# Patient Record
Sex: Male | Born: 1995 | Race: Black or African American | Hispanic: No | Marital: Single | Smoking: Never smoker
Health system: Southern US, Community
[De-identification: ages and names within clinical notes are randomized; demographics above are authoritative.]

## PROBLEM LIST (undated history)

## (undated) DEATH — deceased

---
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
## 2014-10-25 ENCOUNTER — Emergency Department: Payer: Self-pay | Admitting: Emergency Medicine
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
## 2014-12-27 ENCOUNTER — Emergency Department
Admit: 2014-12-27 | Disposition: A | Discharge status: Home / Self Care | Payer: Self-pay | Admitting: Emergency Medicine

## 2016-07-08 IMAGING — CR LEFT THUMB 2+V
1 series · 3 of 3 positions shown · non-contrast
Comparison: None.

CLINICAL DATA: Basketball injury. Thumb pain and tenderness.
Initial encounter.

EXAM:
LEFT THUMB 2+V

[Series 1: pa · 0.17mm/px · 3 of 3 slices shown]
[im 1/3]
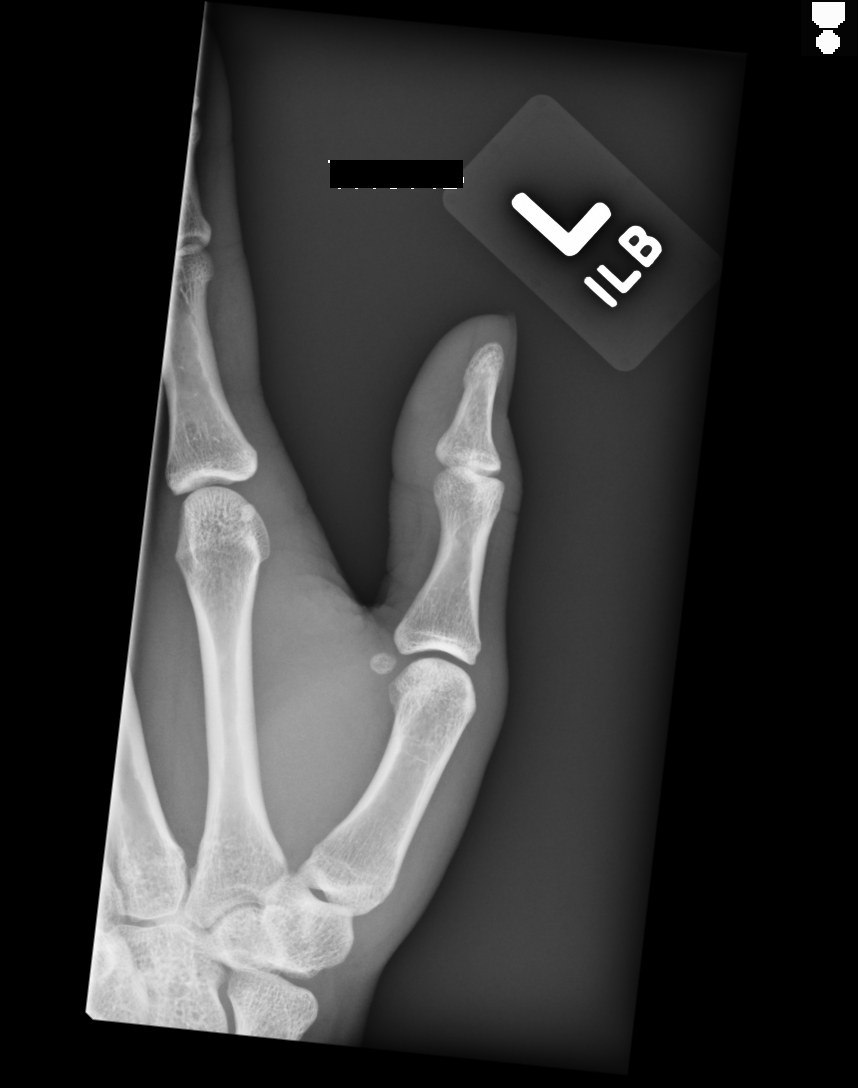
[im 2/3]
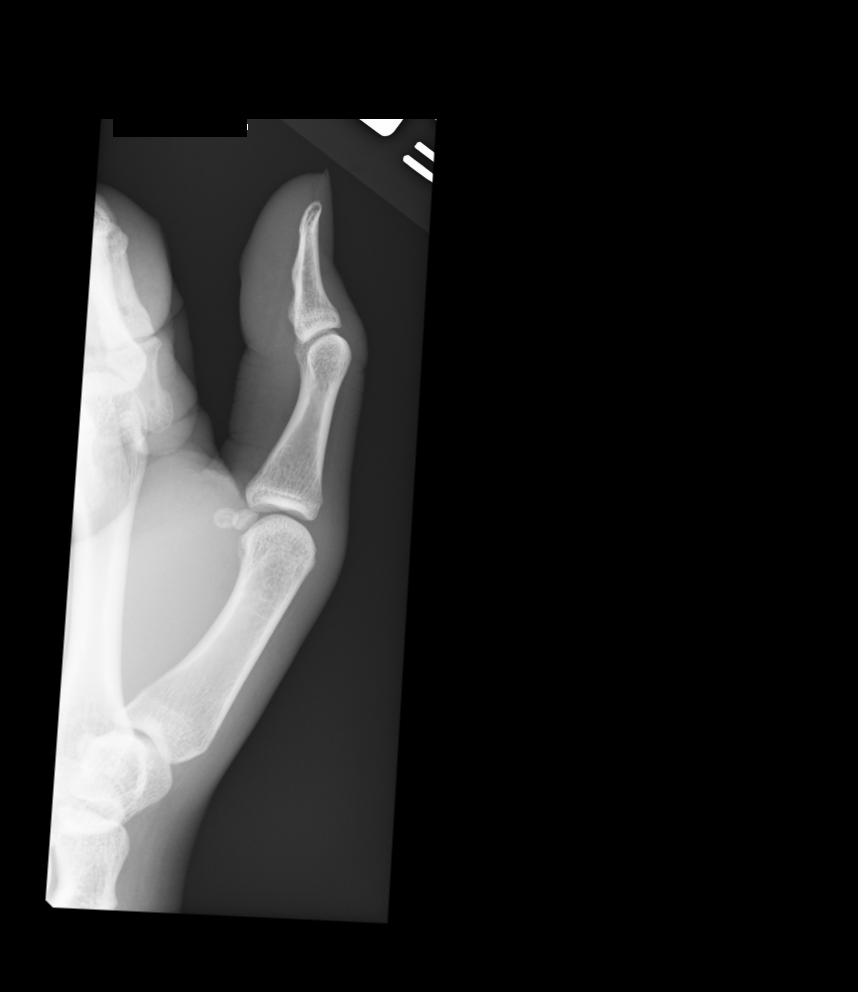
[im 3/3]
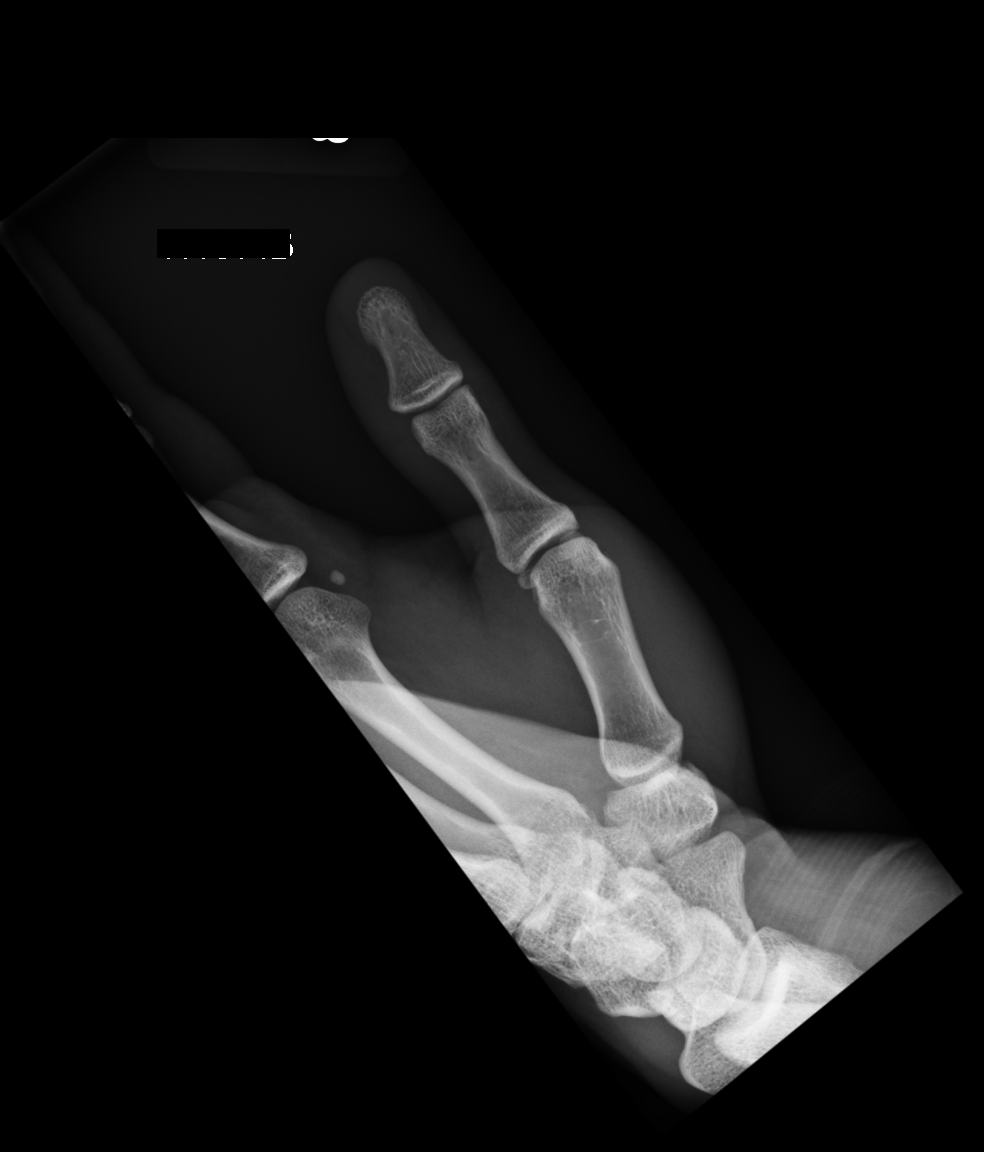

[3 of 3 positions shown; findings below may reference images not displayed]

FINDINGS: There is no evidence of fracture or dislocation. There is no
evidence of arthropathy or other focal bone abnormality. Soft
tissues are unremarkable
IMPRESSION: Negative.

## 2021-12-14 ENCOUNTER — Emergency Department: Admission: EM | Admit: 2021-12-14 | Discharge: 2021-12-14 | Payer: Self-pay

## 2021-12-14 DIAGNOSIS — Z0283 Encounter for blood-alcohol and blood-drug test: Secondary | ICD-10-CM | POA: Insufficient documentation

## 2021-12-14 NOTE — ED Triage Notes (Signed)
[employer] for legal blood draw, no other complaints

## 2023-08-11 ENCOUNTER — Emergency Department
Admission: EM | Admit: 2023-08-11 | Discharge: 2023-08-11 | Disposition: A | Discharge status: Home / Self Care | Payer: No Typology Code available for payment source | Attending: Emergency Medicine | Admitting: Emergency Medicine

## 2023-08-11 ENCOUNTER — Other Ambulatory Visit: Payer: Self-pay

## 2023-08-11 ENCOUNTER — Emergency Department: Payer: No Typology Code available for payment source

## 2023-08-11 DIAGNOSIS — M25531 Pain in right wrist: Secondary | ICD-10-CM | POA: Diagnosis present

## 2023-08-11 DIAGNOSIS — M542 Cervicalgia: Secondary | ICD-10-CM | POA: Insufficient documentation

## 2023-08-11 DIAGNOSIS — S0990XA Unspecified injury of head, initial encounter: Secondary | ICD-10-CM | POA: Insufficient documentation

## 2023-08-11 DIAGNOSIS — M25562 Pain in left knee: Secondary | ICD-10-CM | POA: Diagnosis not present

## 2023-08-11 DIAGNOSIS — Y92410 Unspecified street and highway as the place of occurrence of the external cause: Secondary | ICD-10-CM | POA: Diagnosis not present

## 2023-08-11 MED ORDER — ACETAMINOPHEN 500 MG PO TABS
1000.0000 mg | ORAL_TABLET | Freq: Once | ORAL | Status: AC
  Administered 2023-08-11: 1000 mg via ORAL
  Filled 2023-08-11: qty 2

## 2023-08-11 MED ORDER — CYCLOBENZAPRINE HCL 5 MG PO TABS: 5.0000 mg | ORAL_TABLET | Freq: Three times a day (TID) | ORAL | 0 refills | Status: AC | PRN

## 2023-08-11 MED ORDER — CYCLOBENZAPRINE HCL 10 MG PO TABS
5.0000 mg | ORAL_TABLET | Freq: Once | ORAL | Status: AC
  Administered 2023-08-11: 5 mg via ORAL
  Filled 2023-08-11: qty 1

## 2023-08-11 MED ORDER — LIDOCAINE 5 % EX PTCH
1.0000 | MEDICATED_PATCH | CUTANEOUS | Status: DC
  Administered 2023-08-11: 1 via TRANSDERMAL
  Filled 2023-08-11: qty 1

## 2023-08-11 MED ORDER — KETOROLAC TROMETHAMINE 30 MG/ML IJ SOLN
30.0000 mg | Freq: Once | INTRAMUSCULAR | Status: AC
Start: 2023-08-11 — End: 2023-08-11
  Administered 2023-08-11: 30 mg via INTRAMUSCULAR
  Filled 2023-08-11: qty 1
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
